# Patient Record
Sex: Female | Born: 1965 | Race: White | Hispanic: No | Marital: Single | Smoking: Never smoker
Health system: Southern US, Community
[De-identification: ages and names within clinical notes are randomized; demographics above are authoritative.]

## PROBLEM LIST (undated history)

## (undated) DIAGNOSIS — Z889 Allergy status to unspecified drugs, medicaments and biological substances status: Secondary | ICD-10-CM

## (undated) DIAGNOSIS — F419 Anxiety disorder, unspecified: Secondary | ICD-10-CM

## (undated) DIAGNOSIS — G629 Polyneuropathy, unspecified: Secondary | ICD-10-CM

## (undated) DIAGNOSIS — F32A Depression, unspecified: Secondary | ICD-10-CM

## (undated) HISTORY — PX: VESICOVAGINAL FISTULA CLOSURE: SUR270

## (undated) HISTORY — PX: WISDOM TOOTH EXTRACTION: SHX21

---

## 2004-09-20 ENCOUNTER — Inpatient Hospital Stay (HOSPITAL_COMMUNITY): Admission: AD | Admit: 2004-09-20 | Discharge: 2004-09-20 | Payer: Self-pay | Admitting: Obstetrics and Gynecology

## 2004-09-20 ENCOUNTER — Encounter: Payer: Self-pay | Admitting: Emergency Medicine

## 2004-09-24 ENCOUNTER — Inpatient Hospital Stay (HOSPITAL_COMMUNITY): Admission: AD | Admit: 2004-09-24 | Discharge: 2004-09-24 | Payer: Self-pay | Admitting: *Deleted

## 2004-09-25 ENCOUNTER — Emergency Department (HOSPITAL_COMMUNITY): Admission: EM | Admit: 2004-09-25 | Discharge: 2004-09-25 | Payer: Self-pay | Admitting: Emergency Medicine

## 2004-10-05 ENCOUNTER — Ambulatory Visit: Payer: Self-pay | Admitting: Obstetrics and Gynecology

## 2006-02-06 IMAGING — US US OB TRANSVAGINAL MODIFY
1 series · 14 of 28 positions shown · non-contrast
Comparison: none

CLINICAL DATA: Positive urine pregnancy test. Beta-hCG pending. Bleeding for
several days.

TRANSABDOMINAL AND TRANSVAGINAL OB ULTRASOUND LESS THAN 14 WEEKS:

[Series 1: unknown · 0.27mm/px · 14 of 87 slices shown]
[im 4/87]
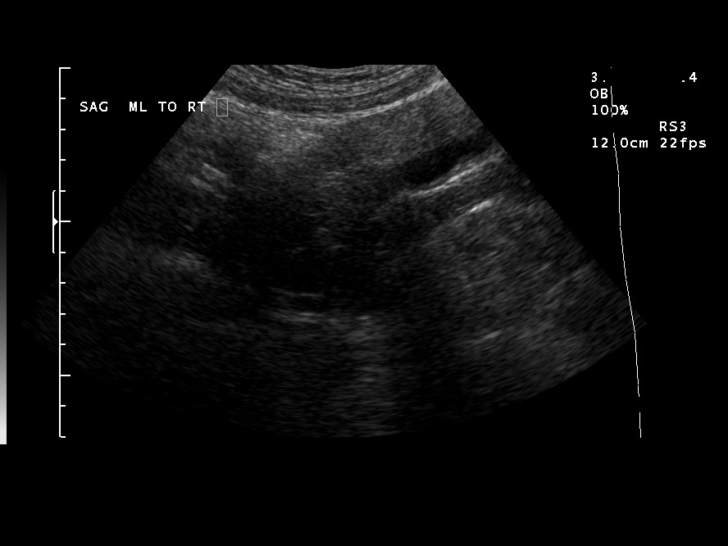
[im 10/87]
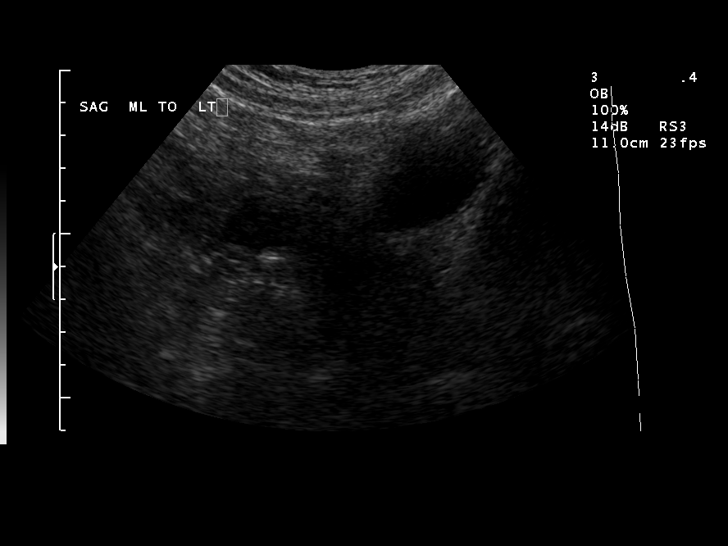
[im 16/87]
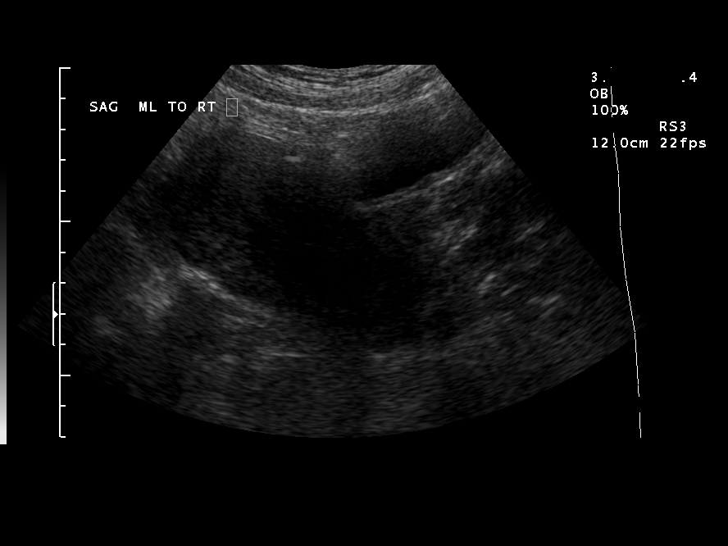
[im 23/87]
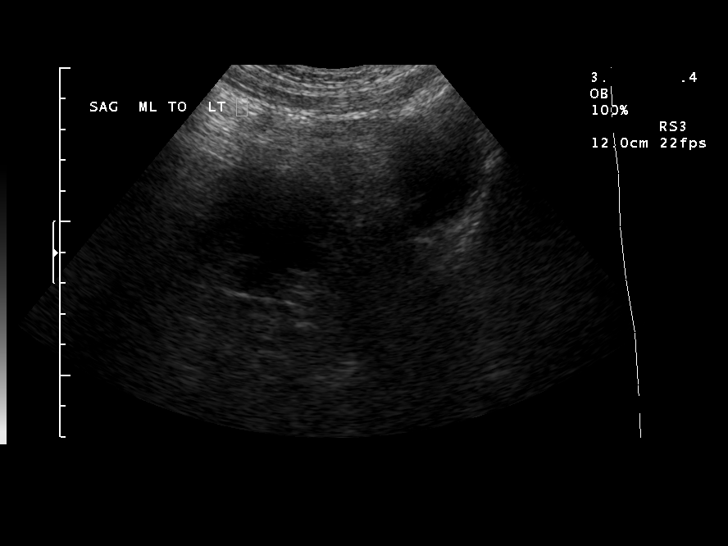
[im 29/87]
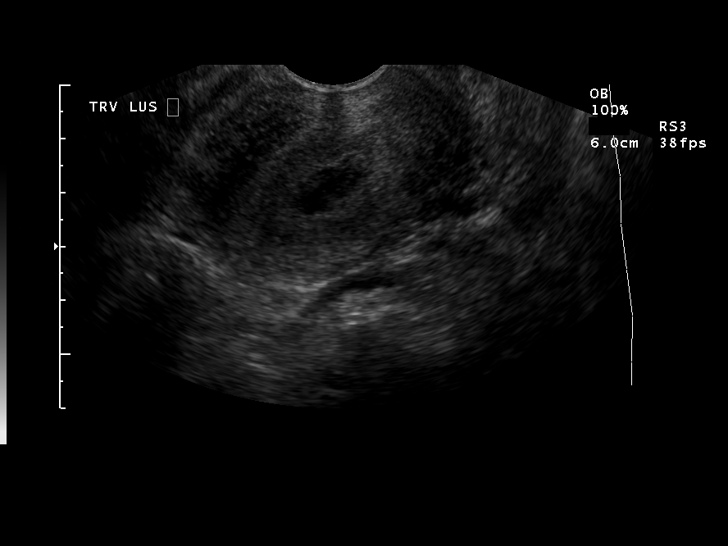
[im 36/87]
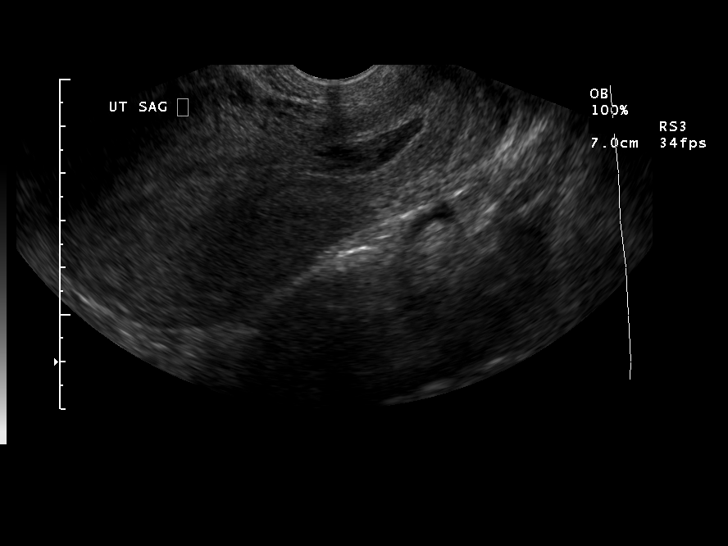
[im 42/87]
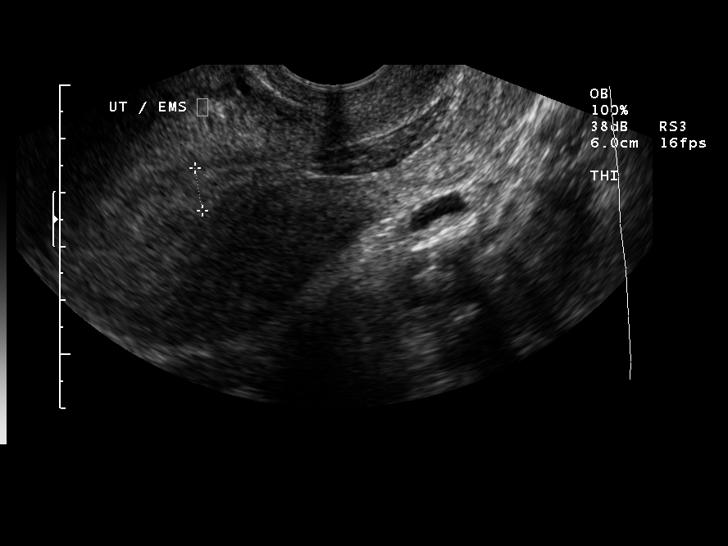
[im 48/87]
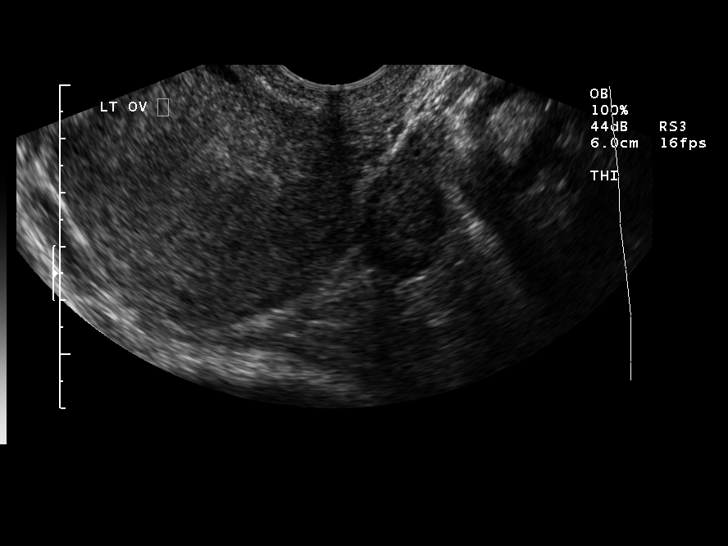
[im 55/87]
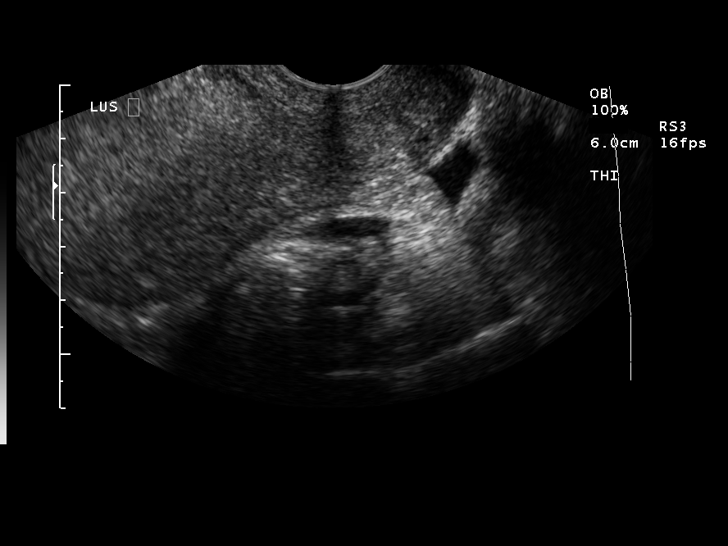
[im 61/87]
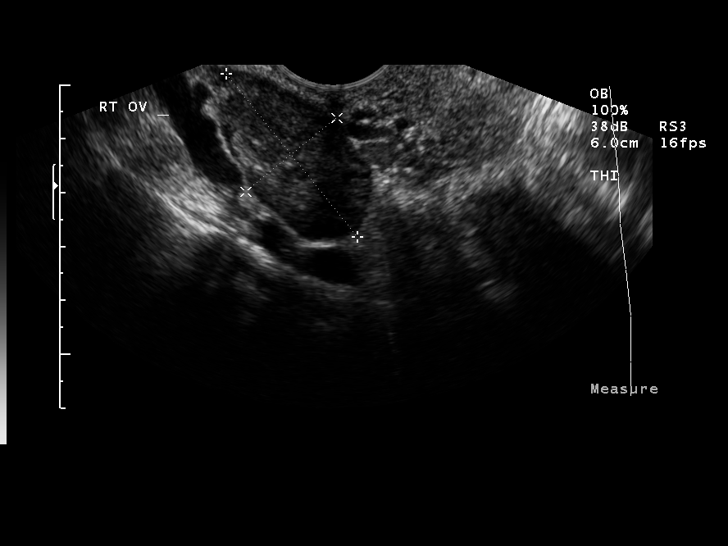
[im 67/87]
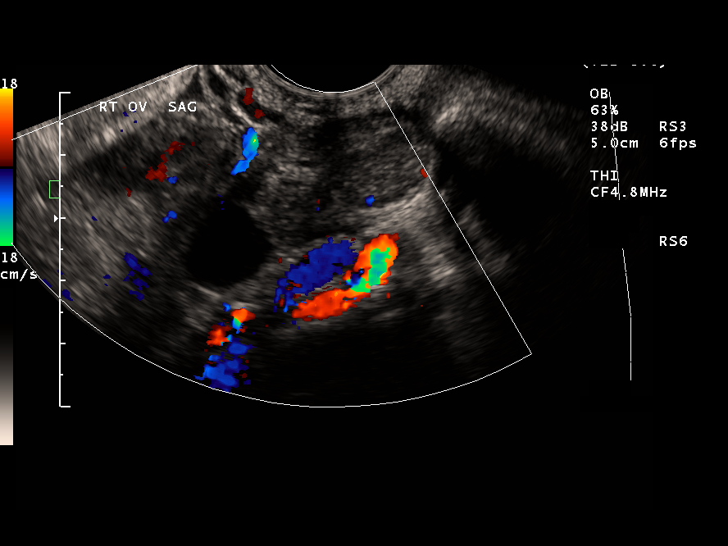
[im 74/87]
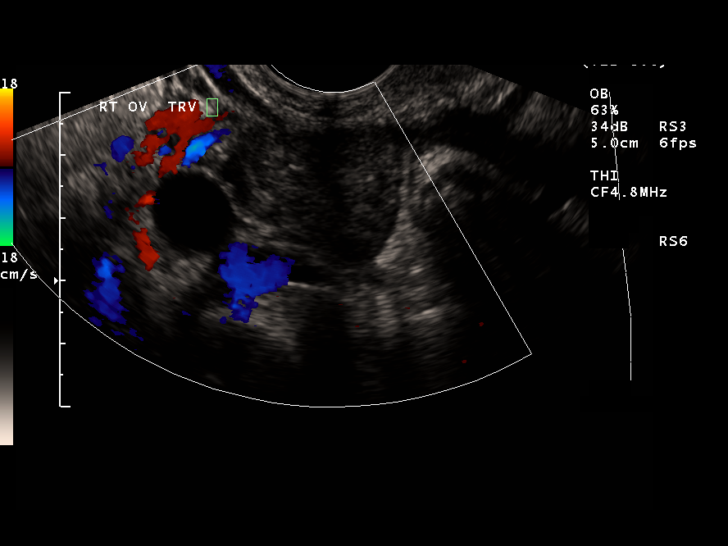
[im 80/87]
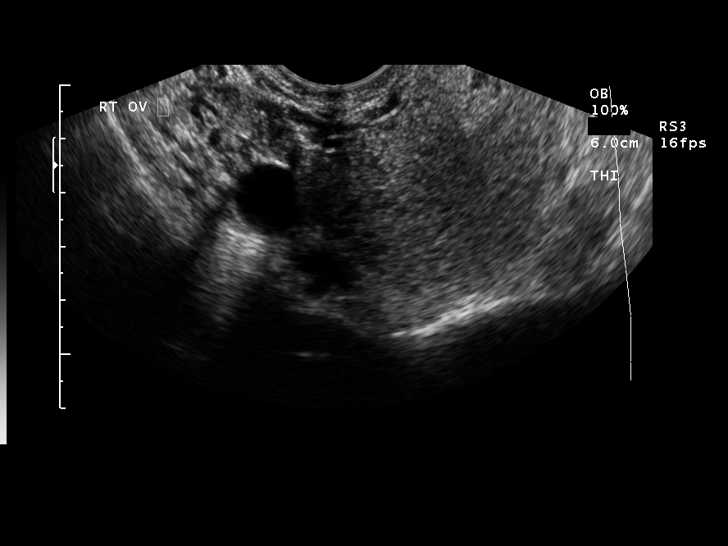
[im 87/87]
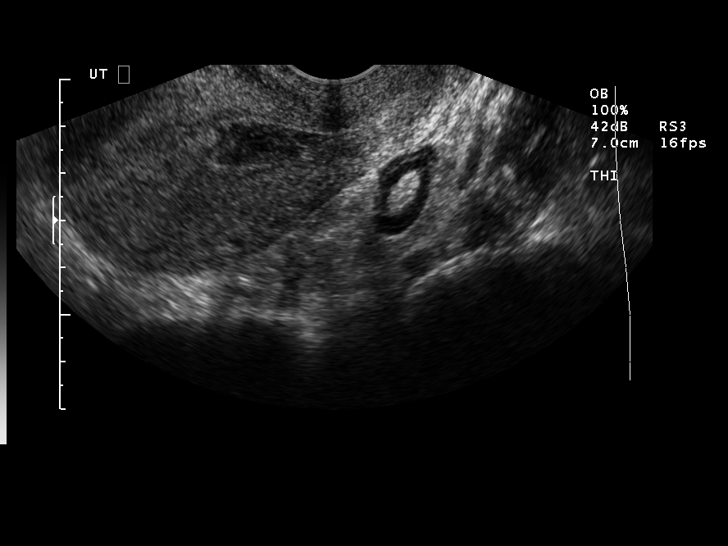

[14 of 28 positions shown; findings below may reference images not displayed]

FINDINGS: No intrauterine pregnancy is identified. There is hypoechoic material
noted within the endometrium of the lower uterine segment, possibly representing
blood. Endometrial stripe measures 8 mm.

Left ovary is normal. There is a 1.5 cm complex cystic area within the right
ovary, possibly hemorrhagic cyst. A small amount of free fluid isn't in the
pelvis.
IMPRESSION: 1. No intrauterine pregnancy currently identified.
2. Complex, hypoechoic material in the lower uterine segment, question blood.
3. Small hemorrhagic cyst on the right ovary.
4. Small amount of free fluid

## 2010-02-19 ENCOUNTER — Ambulatory Visit (HOSPITAL_COMMUNITY): Admission: RE | Admit: 2010-02-19 | Discharge: 2010-02-19 | Payer: Self-pay | Admitting: General Surgery

## 2010-09-26 ENCOUNTER — Encounter: Payer: Self-pay | Admitting: Family Medicine
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
## 2013-02-27 ENCOUNTER — Encounter (HOSPITAL_COMMUNITY): Payer: Self-pay | Admitting: *Deleted

## 2013-02-27 ENCOUNTER — Emergency Department (HOSPITAL_COMMUNITY)
Admission: EM | Admit: 2013-02-27 | Discharge: 2013-02-28 | Disposition: A | Discharge status: Home / Self Care | Payer: No Typology Code available for payment source | Attending: Emergency Medicine | Admitting: Emergency Medicine

## 2013-02-27 ENCOUNTER — Emergency Department (HOSPITAL_COMMUNITY): Payer: Self-pay

## 2013-02-27 DIAGNOSIS — S300XXA Contusion of lower back and pelvis, initial encounter: Secondary | ICD-10-CM

## 2013-02-27 DIAGNOSIS — R55 Syncope and collapse: Secondary | ICD-10-CM | POA: Insufficient documentation

## 2013-02-27 DIAGNOSIS — R42 Dizziness and giddiness: Secondary | ICD-10-CM | POA: Insufficient documentation

## 2013-02-27 DIAGNOSIS — N949 Unspecified condition associated with female genital organs and menstrual cycle: Secondary | ICD-10-CM | POA: Insufficient documentation

## 2013-02-27 LAB — CBC WITH DIFFERENTIAL/PLATELET
Basophils Absolute: 0.1 10*3/uL (ref 0.0–0.1)
Basophils Relative: 1 % (ref 0–1)
Eosinophils Absolute: 0.2 10*3/uL (ref 0.0–0.7)
Eosinophils Relative: 3 % (ref 0–5)
MCH: 29.9 pg (ref 26.0–34.0)
MCHC: 32.3 g/dL (ref 30.0–36.0)
MCV: 92.6 fL (ref 78.0–100.0)
Platelets: 311 10*3/uL (ref 150–400)
RDW: 15.1 % (ref 11.5–15.5)
WBC: 9.7 10*3/uL (ref 4.0–10.5)

## 2013-02-27 LAB — COMPREHENSIVE METABOLIC PANEL
ALT: 19 U/L (ref 0–35)
AST: 17 U/L (ref 0–37)
Albumin: 3.9 g/dL (ref 3.5–5.2)
Alkaline Phosphatase: 76 U/L (ref 39–117)
BUN: 22 mg/dL (ref 6–23)
Chloride: 102 mEq/L (ref 96–112)
Potassium: 3.8 mEq/L (ref 3.5–5.1)
Total Bilirubin: 0.3 mg/dL (ref 0.3–1.2)

## 2013-02-27 LAB — GLUCOSE, CAPILLARY: Glucose-Capillary: 125 mg/dL — ABNORMAL HIGH (ref 70–99)

## 2013-02-27 MED ORDER — HYDROCODONE-ACETAMINOPHEN 5-325 MG PO TABS
1.0000 | ORAL_TABLET | Freq: Once | ORAL | Status: AC
  Administered 2013-02-27: 1 via ORAL
  Filled 2013-02-27: qty 1

## 2013-02-27 NOTE — ED Notes (Signed)
Pt was in [place]  ED with her daughter in room 63. Pt walked out of room and had a syncopal episode. This fall was witnessed per ED staff. Pt states, "i felt like i was in a tunnel then i saw a lot of people around me." pt with cbg of 60. Pt given oj with sugar. Pt was taken to room 5. Pt c/o left hip pain otherwise no other complaints.

## 2013-02-27 NOTE — ED Notes (Signed)
Pts CBG 60- 2 glasses of orange juice given.

## 2013-02-27 NOTE — ED Notes (Signed)
[id number]<AV> Expected date:<BR> Expected time:<BR> Means of arrival:<BR> Comments:<BR> Visitor fall

## 2013-02-27 NOTE — ED Provider Notes (Signed)
History    CSN: [id number] Arrival date & time 02/27/13  2216  First MD Initiated Contact with Patient 02/27/13 2230     Chief Complaint  Patient presents with  . Near Syncope   (Consider location/radiation/quality/duration/timing/severity/associated sxs/prior Treatment) HPI Comments: Patient with no significant PMH presents after passing out and falling while in emergency department. She was with another patient who was being seen for an open forearm fracture and became lightheaded when the surgeon was talking to him. She describes tunnel vision and then waking up with a lot of people standing over her. Blood sugar was found to be 60 and this improved with orange juice. Patient complains of left posterior pelvis pain. Otherwise patient feels well at current time. Onset of symptoms acute. Course is resolved. Nothing makes symptoms better or worse. Patient denies blood loss.  The history is provided by the patient.   History reviewed. No pertinent past medical history. Past Surgical History  Procedure Laterality Date  . Vesicovaginal fistula closure     History reviewed. No pertinent family history. History  Substance Use Topics  . Smoking status: Never Smoker   . Smokeless tobacco: Never Used  . Alcohol Use: No   OB History   Grav Para Term Preterm Abortions TAB SAB Ect Mult Living                 Review of Systems  Constitutional: Negative for fever.  HENT: Negative for sore throat and rhinorrhea.   Eyes: Negative for redness.  Respiratory: Negative for cough.   Cardiovascular: Negative for chest pain.  Gastrointestinal: Negative for nausea, vomiting, abdominal pain and diarrhea.  Genitourinary: Negative for dysuria.  Musculoskeletal: Positive for arthralgias. Negative for myalgias.  Skin: Negative for rash.  Neurological: Positive for syncope and light-headedness. Negative for headaches.    Allergies  Sulfa antibiotics  Home Medications   Current Outpatient Rx   Name  Route  Sig  Dispense  Refill  . ibuprofen (ADVIL,MOTRIN) 200 MG tablet   Oral   Take 600 mg by mouth every 6 (six) hours as needed for pain.          BP 96/54  Pulse 64  Temp(Src) 97.5 F (36.4 C) (Oral)  Resp 16  SpO2 97%  LMP 02/20/2013 Physical Exam  Nursing note and vitals reviewed. Constitutional: She appears well-developed and well-nourished.  HENT:  Head: Normocephalic and atraumatic.  Eyes: Conjunctivae are normal. Right eye exhibits no discharge. Left eye exhibits no discharge.  Neck: Normal range of motion. Neck supple.  Cardiovascular: Normal rate, regular rhythm and normal heart sounds.   Pulmonary/Chest: Effort normal and breath sounds normal.  Abdominal: Soft. There is no tenderness.  Musculoskeletal: She exhibits tenderness.  Tenderness isolated to L ischium. Full ROM of hip joints without pain.   Neurological: She is alert.  Skin: Skin is warm and dry.  Psychiatric: She has a normal mood and affect.    ED Course  Procedures (including critical care time) Labs Reviewed  CBC WITH DIFFERENTIAL - Abnormal; Notable for the following:    RBC 3.78 (*)    Hemoglobin 11.3 (*)    HCT 35.0 (*)    Neutrophils Relative % 41 (*)    Lymphocytes Relative 47 (*)    Lymphs Abs 4.5 (*)    All other components within normal limits  COMPREHENSIVE METABOLIC PANEL   Dg Pelvis 1-2 Views  02/27/2013   *RADIOLOGY REPORT*  Clinical Data: Fall, pain.  PELVIS - 1-2 VIEW  Comparison: None.  Findings: No acute bony abnormality.  Specifically, no fracture, subluxation, or dislocation.  Soft tissues are intact.  Hip joints and SI joints are symmetric and unremarkable.  IMPRESSION: No bony abnormality.   Original Report Authenticated By: Charlett Nose, M.D.   1. Syncope   2. Contusion of pelvis, initial encounter     11:02 PM Patient seen and examined. Work-up initiated. Medications ordered.   Vital signs reviewed and are as follows: Filed Vitals:   02/27/13 2224  BP:  96/54  Pulse: 64  Temp: 97.5 F (36.4 C)  Resp: 16    Date: 02/28/2013  Rate: 71  Rhythm: normal sinus rhythm  QRS Axis: normal  Intervals: normal  ST/T Wave abnormalities: normal  Conduction Disutrbances:none  Narrative Interpretation:   Old EKG Reviewed: none available  12:27 AM Patient informed of results. She ambulates well without lightheadedness. Discussed conservative management for contusion.   Urged rest and hydrate well.   Patient urged to return with worsening symptoms or other concerns. Patient verbalized understanding and agrees with plan.   MDM  Syncope: while in ED with patient with open fracture. Suspect vasovagal. Work-up is unrevealing. Pt is not orthostatic. Anemia mild.   Pelvis pain: neg x-ray. Ambulating well. Doubt occult fracture.    Renne Crigler, PA-C 02/28/13 0030

## 2013-02-27 NOTE — ED Notes (Signed)
Patient resting at this time. Blood glucose better, the patient is without any new complaints. Is awake and alert- oriented x 3

## 2013-02-28 MED ORDER — HYDROCODONE-ACETAMINOPHEN 5-325 MG PO TABS: ORAL_TABLET | ORAL | Status: DC

## 2013-02-28 MED ORDER — IBUPROFEN 600 MG PO TABS: 600.0000 mg | ORAL_TABLET | Freq: Four times a day (QID) | ORAL | Status: DC | PRN

## 2013-02-28 NOTE — ED Provider Notes (Signed)
Medical screening examination/treatment/procedure(s) were performed by non-physician practitioner and as supervising physician I was immediately available for consultation/collaboration.  Winfred Iiams, MD 02/28/13 0415 

## 2013-02-28 NOTE — ED Notes (Signed)
Pt was able to ambulate without no dizziness w/ Clinical research associate

## 2014-07-16 IMAGING — CR DG PELVIS 1-2V
1 series · 1 of 1 positions shown · non-contrast
Comparison: None.

CLINICAL DATA: Fall, pain.

PELVIS - 1-2 VIEW

[t pelvis ap]
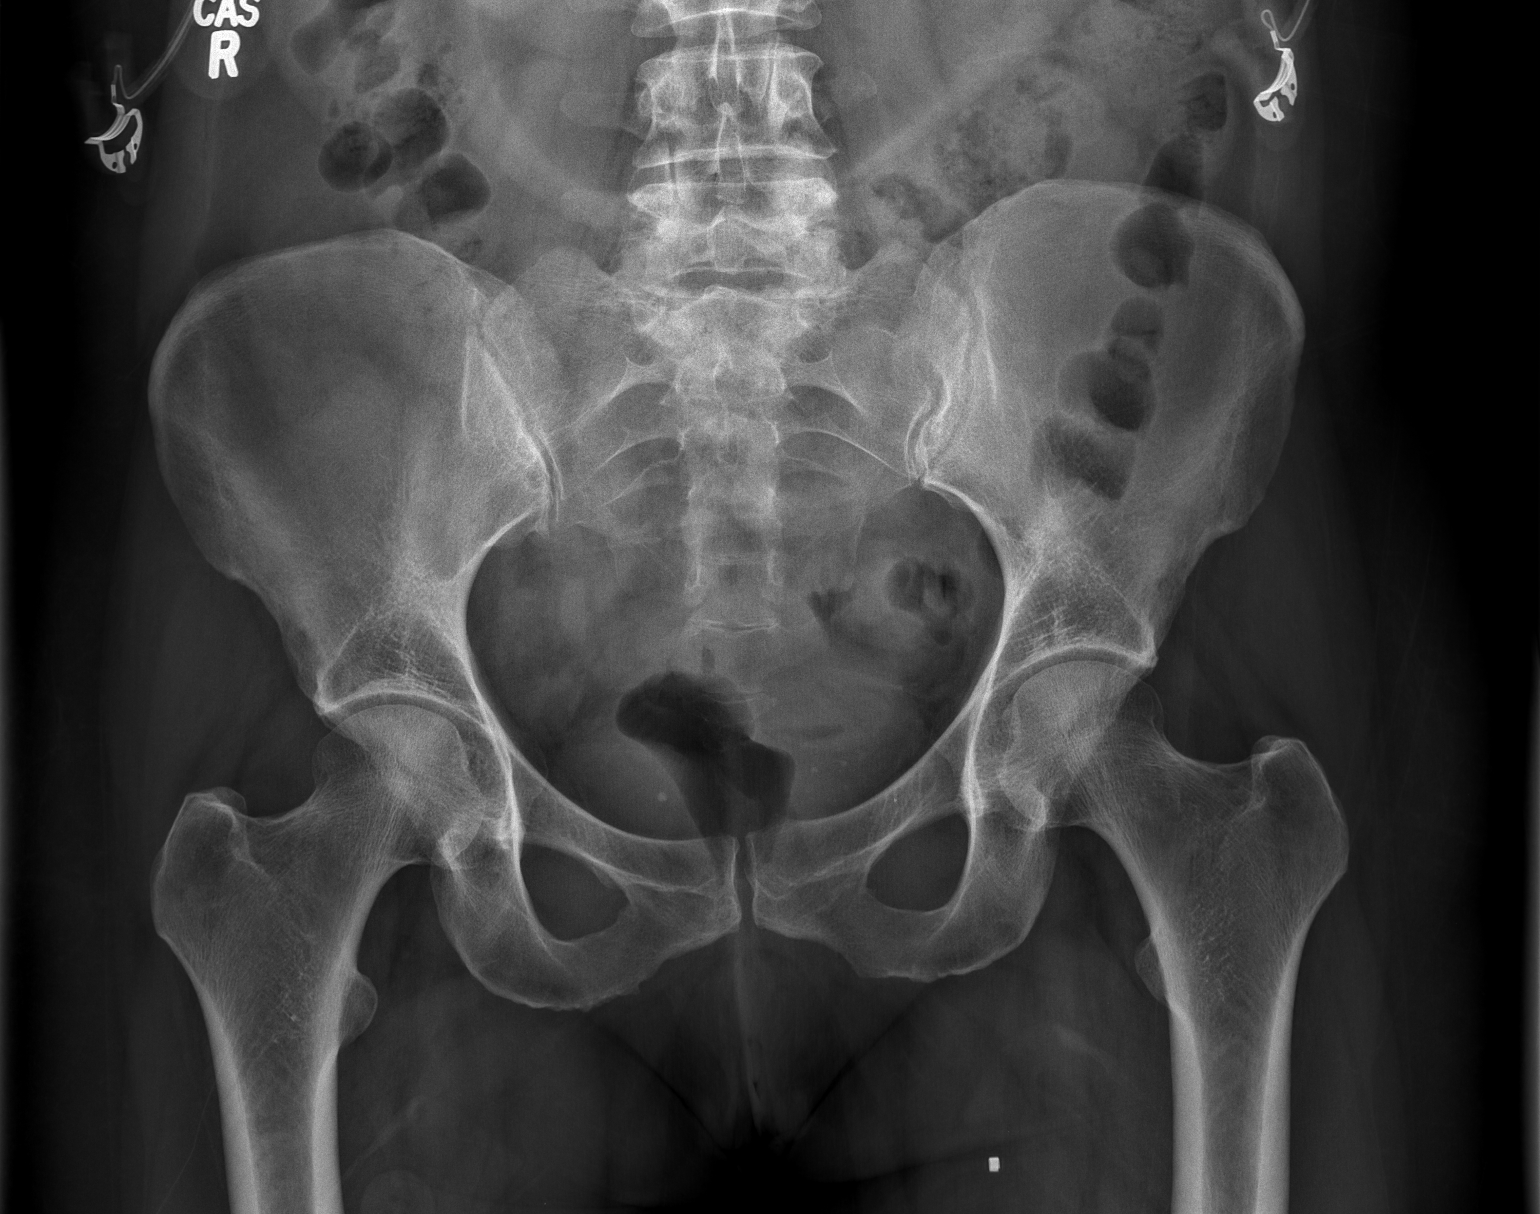

[1 of 1 positions shown; findings below may reference images not displayed]

FINDINGS: No acute bony abnormality.  Specifically, no fracture,
subluxation, or dislocation.  Soft tissues are intact.  Hip joints
and SI joints are symmetric and unremarkable.
IMPRESSION: No bony abnormality.

## 2020-05-10 ENCOUNTER — Emergency Department (HOSPITAL_COMMUNITY): Payer: 59

## 2020-05-10 ENCOUNTER — Emergency Department (HOSPITAL_COMMUNITY)
Admission: EM | Admit: 2020-05-10 | Discharge: 2020-05-10 | Disposition: A | Discharge status: Home / Self Care | Payer: 59 | Attending: Emergency Medicine | Admitting: Emergency Medicine

## 2020-05-10 ENCOUNTER — Encounter (HOSPITAL_COMMUNITY): Payer: Self-pay

## 2020-05-10 ENCOUNTER — Other Ambulatory Visit: Payer: Self-pay

## 2020-05-10 DIAGNOSIS — Z79899 Other long term (current) drug therapy: Secondary | ICD-10-CM | POA: Diagnosis not present

## 2020-05-10 DIAGNOSIS — S59912A Unspecified injury of left forearm, initial encounter: Secondary | ICD-10-CM | POA: Diagnosis present

## 2020-05-10 DIAGNOSIS — Y999 Unspecified external cause status: Secondary | ICD-10-CM | POA: Diagnosis not present

## 2020-05-10 DIAGNOSIS — Y9301 Activity, walking, marching and hiking: Secondary | ICD-10-CM | POA: Insufficient documentation

## 2020-05-10 DIAGNOSIS — S52615A Nondisplaced fracture of left ulna styloid process, initial encounter for closed fracture: Secondary | ICD-10-CM | POA: Insufficient documentation

## 2020-05-10 DIAGNOSIS — S52592A Other fractures of lower end of left radius, initial encounter for closed fracture: Secondary | ICD-10-CM

## 2020-05-10 DIAGNOSIS — S52502A Unspecified fracture of the lower end of left radius, initial encounter for closed fracture: Secondary | ICD-10-CM | POA: Insufficient documentation

## 2020-05-10 DIAGNOSIS — Y929 Unspecified place or not applicable: Secondary | ICD-10-CM | POA: Insufficient documentation

## 2020-05-10 DIAGNOSIS — W010XXA Fall on same level from slipping, tripping and stumbling without subsequent striking against object, initial encounter: Secondary | ICD-10-CM | POA: Diagnosis not present

## 2020-05-10 MED ORDER — HYDROCODONE-ACETAMINOPHEN 5-325 MG PO TABS
1.0000 | ORAL_TABLET | Freq: Once | ORAL | Status: AC
  Administered 2020-05-10: 1 via ORAL
  Filled 2020-05-10: qty 1

## 2020-05-10 MED ORDER — HYDROCODONE-ACETAMINOPHEN 5-325 MG PO TABS: 1.0000 | ORAL_TABLET | ORAL | 0 refills | Status: AC | PRN

## 2020-05-10 NOTE — Discharge Instructions (Addendum)
Home to rest.  Apply ice and elevate for 20 to time only 3 times daily. Take Norco as needed as prescribed for pain.  Do not drive or operate machinery if taking Norco.  Take Colace if you develop constipation. Leave your splint on, keep it clean, keep it dry. Follow-up with orthopedics, call on Tuesday to schedule an appointment.

## 2020-05-10 NOTE — ED Notes (Signed)
Ortho called for splint and shoulder immobilizer.

## 2020-05-10 NOTE — Progress Notes (Signed)
Orthopedic Tech Progress Note Patient Details:  Pamela Black [birth year] [id number]  Ortho Devices Type of Ortho Device: Arm sling, Sugartong splint Ortho Device/Splint Location: LUE Ortho Device/Splint Interventions: Ordered, Application   Post Interventions Patient Tolerated: Well Instructions Provided: Care of device   Donald Pore 05/10/2020, 11:34 AM

## 2020-05-10 NOTE — ED Provider Notes (Signed)
Medical screening examination/treatment/procedure(s) were conducted as a shared visit with non-physician practitioner(s) and myself.  I personally evaluated the patient during the encounter.  Clinical Impression:   Final diagnoses:  Other closed fracture of distal end of left radius, initial encounter  Closed nondisplaced fracture of styloid process of left ulna, initial encounter    This patient has a swollen left wrist after having a fall on an outstretched hand.  Neurovascularly intact, able to open and close the hand but with some pain.  Decreased range of motion of the wrist.  Normal sensation diffusely.  X-ray shows a distal radial fracture, patient will be immobilized and followed up with orthopedics.  Stable for discharge and patient understands indications for return   Eber Hong, MD 05/10/20 1214

## 2020-05-10 NOTE — ED Provider Notes (Signed)
[facility] EMERGENCY DEPARTMENT Provider Note   CSN: [id number] Arrival date & time: 05/10/20  2330     History No chief complaint on file.   Pamela Black is a 54 y.o. female.  54 year old female presents with complaint of left wrist and forearm pain.  Patient states that she was mowing the grass yesterday and while walking backwards tripped and fell landing on outstretched left hand.  Patient is left-handed dominant, reports pain and swelling left wrist with pain radiating up to the left elbow.  No other injuries, complaints, concerns.        History reviewed. No pertinent past medical history.  There are no problems to display for this patient.   Past Surgical History:  Procedure Laterality Date  . VESICOVAGINAL FISTULA CLOSURE       OB History   No obstetric history on file.     No family history on file.  Social History   Tobacco Use  . Smoking status: Never Smoker  . Smokeless tobacco: Never Used  Substance Use Topics  . Alcohol use: No  . Drug use: Yes    Types: Marijuana    Home Medications Prior to Admission medications   Medication Sig Start Date End Date Taking? Authorizing Provider  HYDROcodone-acetaminophen (NORCO/VICODIN) 5-325 MG tablet Take 1 tablet by mouth every 4 (four) hours as needed. 05/10/20   Jeannie Fend, PA-C  ibuprofen (ADVIL,MOTRIN) 600 MG tablet Take 1 tablet (600 mg total) by mouth every 6 (six) hours as needed for pain. 02/28/13   Renne Crigler, PA-C    Allergies    Sulfa antibiotics  Review of Systems   Review of Systems  Constitutional: Negative for fever.  Musculoskeletal: Positive for arthralgias, joint swelling and myalgias.  Skin: Negative for color change, rash and wound.  Allergic/Immunologic: Negative for immunocompromised state.  Neurological: Negative for weakness and numbness.  All other systems reviewed and are negative.   Physical Exam Updated Vital Signs BP 136/75   Pulse (!) 44    Temp 98.7 F (37.1 C) (Oral)   Resp 18   SpO2 98%   Physical Exam Vitals and nursing note reviewed.  Constitutional:      General: She is not in acute distress.    Appearance: She is well-developed. She is not diaphoretic.  HENT:     Head: Normocephalic and atraumatic.  Cardiovascular:     Pulses: Normal pulses.  Pulmonary:     Effort: Pulmonary effort is normal.  Musculoskeletal:        General: Swelling, tenderness and deformity present.     Left shoulder: Normal. No swelling, tenderness or bony tenderness.     Left elbow: No swelling or deformity. Normal range of motion. No tenderness.     Left wrist: Swelling, deformity and tenderness present. Decreased range of motion. Normal pulse.     Left hand: No bony tenderness. Normal sensation.     Comments: Swelling with slight deformity to distal left radius with tenderness to same, significantly limited range of motion left wrist.  Brisk capillary refill present in each digit, sensation intact, palpable radial pulse.  Skin:    General: Skin is warm and dry.     Capillary Refill: Capillary refill takes less than 2 seconds.     Findings: No erythema or rash.  Neurological:     Mental Status: She is alert and oriented to person, place, and time.     Sensory: No sensory deficit.  Psychiatric:  Behavior: Behavior normal.     ED Results / Procedures / Treatments   Labs (all labs ordered are listed, but only abnormal results are displayed) Labs Reviewed - No data to display  EKG None  Radiology DG Forearm Left  Result Date: 05/10/2020 CLINICAL DATA:  Larey Seat last night.  Left wrist and forearm pain. EXAM: LEFT FOREARM - 2 VIEW COMPARISON:  Current left wrist radiographs. FINDINGS: Fracture of the distal radius and ulnar styloid are again noted, described under the left wrist radiographs. No additional fractures. Wrist and elbow joints are normally spaced and aligned. There is distal forearm and wrist soft tissue swelling.  IMPRESSION: 1. Distal radius and ulnar styloid fractures as detailed under the left wrist radiographs. 2. No other fractures.  No dislocation Electronically Signed   By: Amie Portland M.D.   On: 05/10/2020 10:23   DG Wrist Complete Left  Result Date: 05/10/2020 CLINICAL DATA:  Larey Seat last night.  Left wrist and forearm pain. EXAM: LEFT WRIST - COMPLETE 3+ VIEW COMPARISON:  None. FINDINGS: Transverse fracture of the distal radial metaphysis, with no evidence of an intra-articular component. There is mild comminution and dorsal impaction of the fracture leading to mild dorsal angulation of the distal radial articular surface of approximately 15 degrees. Fracture is slightly displaced posteriorly by 3 mm. Associated nondisplaced ulnar styloid fracture. No other fractures.  Joints are normally spaced and aligned. There is diffuse soft tissue swelling. IMPRESSION: 1. Fracture of distal radius as detailed with an associated nondisplaced ulnar styloid fracture. No dislocation. Electronically Signed   By: Amie Portland M.D.   On: 05/10/2020 10:22    Procedures Procedures (including critical care time)  Medications Ordered in ED Medications  HYDROcodone-acetaminophen (NORCO/VICODIN) 5-325 MG per tablet 1 tablet (1 tablet Oral Given 05/10/20 1149)    ED Course  I have reviewed the triage vital signs and the nursing notes.  Pertinent labs & imaging results that were available during my care of the patient were reviewed by me and considered in my medical decision making (see chart for details).  Clinical Course as of May 10 1150  Sun May 10, 2020  5313 54 year old female presents with fall and injury to dominant left wrist.  On exam, has tenderness with swelling to left wrist, x-ray with distal radius and ulnar styloid fractures. Patient was given Norco for pain with plan for sugar tong splint, sling, home to rest, ice, elevate, follow-up with Ortho.   [LM]  1058 Case discussed with Dr. Aundria Rud, on-call with  hand Ortho, agrees with plan of care, recommends follow-up with his colleague Dr. Roney Mans this week.   [LM]  1149 SPLINT APPLICATION Date/Time: 11:49 AM Authorized by: Jeannie Fend Consent: Verbal consent obtained. Risks and benefits: risks, benefits and alternatives were discussed Consent given by: patient Splint applied by: orthopedic technician Location details: left arm Splint type: sugar tong with sling Supplies used: fiberglass OCL, ace, sling Post-procedure: The splinted body part was neurovascularly unchanged following the procedure. Patient tolerance: Patient tolerated the procedure well with no immediate complications.      [LM]    Clinical Course User Index [LM] Alden Hipp   MDM Rules/Calculators/A&P                           Final Clinical Impression(s) / ED Diagnoses Final diagnoses:  Other closed fracture of distal end of left radius, initial encounter  Closed nondisplaced fracture of styloid process of  left ulna, initial encounter    Rx / DC Orders ED Discharge Orders         Ordered    HYDROcodone-acetaminophen (NORCO/VICODIN) 5-325 MG tablet  Every 4 hours PRN        05/10/20 1048           Alden Hipp 05/10/20 1151    Eber Hong, MD 05/10/20 1214

## 2020-05-10 NOTE — ED Triage Notes (Signed)
Patient complains of left wrist and forearm pain after fall last night, mild swelling to same, positive distal pulses

## 2020-05-14 ENCOUNTER — Encounter (HOSPITAL_COMMUNITY): Payer: Self-pay | Admitting: Orthopedic Surgery

## 2020-05-14 ENCOUNTER — Other Ambulatory Visit: Payer: Self-pay

## 2020-05-14 NOTE — Progress Notes (Signed)
Pamela Black denies chest pain or shortness of breath. Patient will be tested for Covid tomorrow.

## 2020-05-15 ENCOUNTER — Other Ambulatory Visit (HOSPITAL_COMMUNITY)
Admission: RE | Admit: 2020-05-15 | Discharge: 2020-05-15 | Disposition: A | Discharge status: Home / Self Care | Payer: 59 | Source: Ambulatory Visit | Attending: Orthopedic Surgery | Admitting: Orthopedic Surgery

## 2020-05-15 ENCOUNTER — Encounter (HOSPITAL_COMMUNITY): Payer: Self-pay | Admitting: Orthopedic Surgery

## 2020-05-15 DIAGNOSIS — Z01812 Encounter for preprocedural laboratory examination: Secondary | ICD-10-CM | POA: Diagnosis present

## 2020-05-15 DIAGNOSIS — Z20822 Contact with and (suspected) exposure to covid-19: Secondary | ICD-10-CM | POA: Diagnosis not present

## 2020-05-15 LAB — SARS CORONAVIRUS 2 (TAT 6-24 HRS): SARS Coronavirus 2: NEGATIVE

## 2020-05-15 NOTE — Anesthesia Preprocedure Evaluation (Addendum)
Anesthesia Evaluation  Patient identified by MRN, date of birth, ID band Patient awake    Reviewed: Allergy & Precautions, NPO status , Patient's Chart, lab work & pertinent test results  Airway Mallampati: I  TM Distance: >3 FB Neck ROM: Full    Dental  (+) Teeth Intact, Dental Advisory Given   Pulmonary neg pulmonary ROS,    breath sounds clear to auscultation       Cardiovascular negative cardio ROS   Rhythm:Regular Rate:Normal     Neuro/Psych negative neurological ROS     GI/Hepatic negative GI ROS, Neg liver ROS,   Endo/Other  negative endocrine ROS  Renal/GU negative Renal ROS     Musculoskeletal   Abdominal Normal abdominal exam  (+)   Peds  Hematology negative hematology ROS (+)   Anesthesia Other Findings   Reproductive/Obstetrics                            Anesthesia Physical Anesthesia Plan  ASA: II  Anesthesia Plan: Regional   Post-op Pain Management:    Induction: Intravenous  PONV Risk Score and Plan: 3 and Ondansetron, Propofol infusion and Midazolam  Airway Management Planned: Natural Airway and Simple Face Mask  Additional Equipment: None  Intra-op Plan:   Post-operative Plan:   Informed Consent: I have reviewed the patients History and Physical, chart, labs and discussed the procedure including the risks, benefits and alternatives for the proposed anesthesia with the patient or authorized representative who has indicated his/her understanding and acceptance.     Dental advisory given  Plan Discussed with: CRNA  Anesthesia Plan Comments: (Also consented for GA  Lab Results      Component                Value               Date                      WBC                      9.7                 02/27/2013                HGB                      11.3 (L)            02/27/2013                HCT                      35.0 (L)            02/27/2013                 MCV                      92.6                02/27/2013                PLT                      311                 02/27/2013           )  Anesthesia Quick Evaluation  

## 2020-05-16 ENCOUNTER — Ambulatory Visit (HOSPITAL_COMMUNITY): Payer: 59

## 2020-05-16 ENCOUNTER — Ambulatory Visit (HOSPITAL_COMMUNITY): Payer: 59 | Admitting: Anesthesiology

## 2020-05-16 ENCOUNTER — Encounter (HOSPITAL_COMMUNITY)
Admission: AC | Disposition: A | Discharge status: Home / Self Care | Payer: Self-pay | Source: Home / Self Care | Attending: Orthopedic Surgery

## 2020-05-16 ENCOUNTER — Other Ambulatory Visit: Payer: Self-pay

## 2020-05-16 ENCOUNTER — Ambulatory Visit (HOSPITAL_COMMUNITY)
Admission: AC | Admit: 2020-05-16 | Discharge: 2020-05-16 | Disposition: A | Discharge status: Home / Self Care | Payer: 59 | Attending: Orthopedic Surgery | Admitting: Orthopedic Surgery

## 2020-05-16 ENCOUNTER — Encounter (HOSPITAL_COMMUNITY): Payer: Self-pay | Admitting: Orthopedic Surgery

## 2020-05-16 DIAGNOSIS — S52572A Other intraarticular fracture of lower end of left radius, initial encounter for closed fracture: Secondary | ICD-10-CM | POA: Diagnosis not present

## 2020-05-16 DIAGNOSIS — X58XXXA Exposure to other specified factors, initial encounter: Secondary | ICD-10-CM | POA: Diagnosis not present

## 2020-05-16 DIAGNOSIS — Z882 Allergy status to sulfonamides status: Secondary | ICD-10-CM | POA: Diagnosis not present

## 2020-05-16 DIAGNOSIS — F329 Major depressive disorder, single episode, unspecified: Secondary | ICD-10-CM | POA: Diagnosis not present

## 2020-05-16 DIAGNOSIS — Z79899 Other long term (current) drug therapy: Secondary | ICD-10-CM | POA: Diagnosis not present

## 2020-05-16 DIAGNOSIS — G629 Polyneuropathy, unspecified: Secondary | ICD-10-CM | POA: Insufficient documentation

## 2020-05-16 DIAGNOSIS — Y939 Activity, unspecified: Secondary | ICD-10-CM | POA: Insufficient documentation

## 2020-05-16 DIAGNOSIS — Z885 Allergy status to narcotic agent status: Secondary | ICD-10-CM | POA: Insufficient documentation

## 2020-05-16 HISTORY — DX: Depression, unspecified: F32.A

## 2020-05-16 HISTORY — DX: Allergy status to unspecified drugs, medicaments and biological substances: Z88.9

## 2020-05-16 HISTORY — DX: Polyneuropathy, unspecified: G62.9

## 2020-05-16 HISTORY — DX: Anxiety disorder, unspecified: F41.9

## 2020-05-16 HISTORY — PX: ORIF WRIST FRACTURE: SHX2133

## 2020-05-16 SURGERY — OPEN REDUCTION INTERNAL FIXATION (ORIF) WRIST FRACTURE
Anesthesia: Regional | Site: Wrist | Laterality: Left

## 2020-05-16 MED ORDER — HYDROMORPHONE HCL 1 MG/ML IJ SOLN: 0.2500 mg | INTRAMUSCULAR | Status: DC | PRN

## 2020-05-16 MED ORDER — DEXAMETHASONE SODIUM PHOSPHATE 10 MG/ML IJ SOLN
INTRAMUSCULAR | Status: DC | PRN
  Administered 2020-05-16: 5 mg via INTRAVENOUS

## 2020-05-16 MED ORDER — LACTATED RINGERS IV SOLN: INTRAVENOUS | Status: DC

## 2020-05-16 MED ORDER — LIDOCAINE 2% (20 MG/ML) 5 ML SYRINGE
INTRAMUSCULAR | Status: DC | PRN
  Administered 2020-05-16: 20 mg via INTRAVENOUS

## 2020-05-16 MED ORDER — MIDAZOLAM HCL 5 MG/5ML IJ SOLN
INTRAMUSCULAR | Status: DC | PRN
  Administered 2020-05-16 (×2): 1 mg via INTRAVENOUS

## 2020-05-16 MED ORDER — OXYCODONE HCL 5 MG PO TABS: 5.0000 mg | ORAL_TABLET | Freq: Once | ORAL | Status: DC | PRN

## 2020-05-16 MED ORDER — DEXAMETHASONE SODIUM PHOSPHATE 10 MG/ML IJ SOLN
INTRAMUSCULAR | Status: AC
  Filled 2020-05-16: qty 1

## 2020-05-16 MED ORDER — LIDOCAINE 2% (20 MG/ML) 5 ML SYRINGE
INTRAMUSCULAR | Status: AC
  Filled 2020-05-16: qty 5

## 2020-05-16 MED ORDER — 0.9 % SODIUM CHLORIDE (POUR BTL) OPTIME
TOPICAL | Status: DC | PRN
  Administered 2020-05-16: 1000 mL

## 2020-05-16 MED ORDER — PHENYLEPHRINE 40 MCG/ML (10ML) SYRINGE FOR IV PUSH (FOR BLOOD PRESSURE SUPPORT)
PREFILLED_SYRINGE | INTRAVENOUS | Status: AC
  Filled 2020-05-16: qty 10

## 2020-05-16 MED ORDER — ONDANSETRON HCL 4 MG/2ML IJ SOLN
INTRAMUSCULAR | Status: DC | PRN
  Administered 2020-05-16: 4 mg via INTRAVENOUS

## 2020-05-16 MED ORDER — ACETAMINOPHEN 325 MG PO TABS: 325.0000 mg | ORAL_TABLET | Freq: Once | ORAL | Status: DC | PRN

## 2020-05-16 MED ORDER — FENTANYL CITRATE (PF) 100 MCG/2ML IJ SOLN
INTRAMUSCULAR | Status: AC
  Filled 2020-05-16: qty 2

## 2020-05-16 MED ORDER — PROPOFOL 500 MG/50ML IV EMUL
INTRAVENOUS | Status: DC | PRN
  Administered 2020-05-16: 60 ug/kg/min via INTRAVENOUS

## 2020-05-16 MED ORDER — ORAL CARE MOUTH RINSE: 15.0000 mL | Freq: Once | OROMUCOSAL | Status: AC

## 2020-05-16 MED ORDER — OXYCODONE HCL 5 MG/5ML PO SOLN: 5.0000 mg | Freq: Once | ORAL | Status: DC | PRN

## 2020-05-16 MED ORDER — ACETAMINOPHEN 160 MG/5ML PO SOLN: 325.0000 mg | Freq: Once | ORAL | Status: DC | PRN

## 2020-05-16 MED ORDER — CEFAZOLIN SODIUM-DEXTROSE 2-4 GM/100ML-% IV SOLN
2.0000 g | INTRAVENOUS | Status: AC
  Administered 2020-05-16: 2 g via INTRAVENOUS
  Filled 2020-05-16: qty 100

## 2020-05-16 MED ORDER — MEPERIDINE HCL 25 MG/ML IJ SOLN: 6.2500 mg | INTRAMUSCULAR | Status: DC | PRN

## 2020-05-16 MED ORDER — ACETAMINOPHEN 10 MG/ML IV SOLN: 1000.0000 mg | Freq: Once | INTRAVENOUS | Status: DC | PRN

## 2020-05-16 MED ORDER — BUPIVACAINE-EPINEPHRINE (PF) 0.5% -1:200000 IJ SOLN
INTRAMUSCULAR | Status: DC | PRN
  Administered 2020-05-16: 20 mL via PERINEURAL

## 2020-05-16 MED ORDER — MIDAZOLAM HCL 2 MG/2ML IJ SOLN
INTRAMUSCULAR | Status: AC
  Filled 2020-05-16: qty 2

## 2020-05-16 MED ORDER — GLYCOPYRROLATE 0.2 MG/ML IJ SOLN
INTRAMUSCULAR | Status: DC | PRN
  Administered 2020-05-16: .2 mg via INTRAVENOUS

## 2020-05-16 MED ORDER — ONDANSETRON HCL 4 MG/2ML IJ SOLN
INTRAMUSCULAR | Status: AC
  Filled 2020-05-16: qty 2

## 2020-05-16 MED ORDER — AMISULPRIDE (ANTIEMETIC) 5 MG/2ML IV SOLN: 10.0000 mg | Freq: Once | INTRAVENOUS | Status: DC | PRN

## 2020-05-16 MED ORDER — GLYCOPYRROLATE PF 0.2 MG/ML IJ SOSY
PREFILLED_SYRINGE | INTRAMUSCULAR | Status: AC
  Filled 2020-05-16: qty 1

## 2020-05-16 MED ORDER — CHLORHEXIDINE GLUCONATE 0.12 % MT SOLN
OROMUCOSAL | Status: AC
  Administered 2020-05-16: 15 mL via OROMUCOSAL
  Filled 2020-05-16: qty 15

## 2020-05-16 MED ORDER — PROPOFOL 1000 MG/100ML IV EMUL
INTRAVENOUS | Status: AC
  Filled 2020-05-16: qty 300

## 2020-05-16 MED ORDER — FENTANYL CITRATE (PF) 100 MCG/2ML IJ SOLN
INTRAMUSCULAR | Status: DC | PRN
  Administered 2020-05-16 (×2): 25 ug via INTRAVENOUS
  Administered 2020-05-16: 50 ug via INTRAVENOUS

## 2020-05-16 MED ORDER — CHLORHEXIDINE GLUCONATE 0.12 % MT SOLN: 15.0000 mL | Freq: Once | OROMUCOSAL | Status: AC

## 2020-05-16 MED ORDER — PHENYLEPHRINE HCL-NACL 10-0.9 MG/250ML-% IV SOLN
INTRAVENOUS | Status: DC | PRN
  Administered 2020-05-16: 20 ug/min via INTRAVENOUS

## 2020-05-16 MED ORDER — PROPOFOL 500 MG/50ML IV EMUL
INTRAVENOUS | Status: AC
  Filled 2020-05-16: qty 50

## 2020-05-16 MED ORDER — PHENYLEPHRINE HCL (PRESSORS) 10 MG/ML IV SOLN
INTRAVENOUS | Status: DC | PRN
  Administered 2020-05-16: 40 ug via INTRAVENOUS
  Administered 2020-05-16: 80 ug via INTRAVENOUS

## 2020-05-16 MED ORDER — PROPOFOL 10 MG/ML IV BOLUS
INTRAVENOUS | Status: DC | PRN
  Administered 2020-05-16: 30 mg via INTRAVENOUS
  Administered 2020-05-16: 40 mg via INTRAVENOUS

## 2020-05-16 SURGICAL SUPPLY — 63 items
BIT DRILL 2.2 SS TIBIAL (BIT) ×2 IMPLANT
BLADE CLIPPER SURG (BLADE) IMPLANT
BNDG CMPR 9X4 STRL LF SNTH (GAUZE/BANDAGES/DRESSINGS) ×1
BNDG ELASTIC 3X5.8 VLCR STR LF (GAUZE/BANDAGES/DRESSINGS) ×3 IMPLANT
BNDG ELASTIC 4X5.8 VLCR STR LF (GAUZE/BANDAGES/DRESSINGS) ×3 IMPLANT
BNDG ESMARK 4X9 LF (GAUZE/BANDAGES/DRESSINGS) ×3 IMPLANT
BNDG GAUZE ELAST 4 BULKY (GAUZE/BANDAGES/DRESSINGS) ×5 IMPLANT
CANISTER SUCT 3000ML PPV (MISCELLANEOUS) ×3 IMPLANT
CORD BIPOLAR FORCEPS 12FT (ELECTRODE) ×3 IMPLANT
COVER SURGICAL LIGHT HANDLE (MISCELLANEOUS) ×3 IMPLANT
COVER WAND RF STERILE (DRAPES) ×3 IMPLANT
CUFF TOURN SGL QUICK 18X4 (TOURNIQUET CUFF) ×3 IMPLANT
CUFF TOURN SGL QUICK 24 (TOURNIQUET CUFF)
CUFF TRNQT CYL 24X4X16.5-23 (TOURNIQUET CUFF) IMPLANT
DRAIN TLS ROUND 10FR (DRAIN) IMPLANT
DRAPE OEC MINIVIEW 54X84 (DRAPES) ×3 IMPLANT
DRAPE SURG 17X23 STRL (DRAPES) ×3 IMPLANT
DRSG ADAPTIC 3X8 NADH LF (GAUZE/BANDAGES/DRESSINGS) ×2 IMPLANT
GAUZE SPONGE 4X4 12PLY STRL LF (GAUZE/BANDAGES/DRESSINGS) ×2 IMPLANT
GAUZE XEROFORM 5X9 LF (GAUZE/BANDAGES/DRESSINGS) ×2 IMPLANT
GLOVE SS BIOGEL STRL SZ 8 (GLOVE) ×1 IMPLANT
GLOVE SUPERSENSE BIOGEL SZ 8 (GLOVE) ×2
GOWN STRL REUS W/ TWL LRG LVL3 (GOWN DISPOSABLE) ×1 IMPLANT
GOWN STRL REUS W/ TWL XL LVL3 (GOWN DISPOSABLE) ×1 IMPLANT
GOWN STRL REUS W/TWL LRG LVL3 (GOWN DISPOSABLE)
GOWN STRL REUS W/TWL XL LVL3 (GOWN DISPOSABLE) ×6
KIT BASIN OR (CUSTOM PROCEDURE TRAY) ×3 IMPLANT
KIT TURNOVER KIT B (KITS) ×3 IMPLANT
LOOP VESSEL MAXI BLUE (MISCELLANEOUS) IMPLANT
MANIFOLD NEPTUNE II (INSTRUMENTS) ×1 IMPLANT
NEEDLE 22X1 1/2 (OR ONLY) (NEEDLE) IMPLANT
NS IRRIG 1000ML POUR BTL (IV SOLUTION) ×3 IMPLANT
PACK ORTHO EXTREMITY (CUSTOM PROCEDURE TRAY) ×3 IMPLANT
PAD ARMBOARD 7.5X6 YLW CONV (MISCELLANEOUS) ×6 IMPLANT
PAD CAST 3X4 CTTN HI CHSV (CAST SUPPLIES) ×1 IMPLANT
PAD CAST 4YDX4 CTTN HI CHSV (CAST SUPPLIES) ×1 IMPLANT
PADDING CAST COTTON 3X4 STRL (CAST SUPPLIES) ×3
PADDING CAST COTTON 4X4 STRL (CAST SUPPLIES) ×3
PEG LOCKING SMOOTH 2.2X18 (Peg) ×6 IMPLANT
PEG LOCKING SMOOTH 2.2X20 (Screw) ×4 IMPLANT
PEG LOCKING SMOOTH 2.2X22 (Screw) ×4 IMPLANT
PILLOW ARM CARTER ADULT (MISCELLANEOUS) ×2 IMPLANT
PLATE NARROW DVR LEFT (Plate) ×2 IMPLANT
SCREW LOCK 12X2.7X 3 LD (Screw) IMPLANT
SCREW LOCK 14X2.7X 3 LD TPR (Screw) IMPLANT
SCREW LOCKING 2.7X12MM (Screw) ×6 IMPLANT
SCREW LOCKING 2.7X13MM (Screw) ×2 IMPLANT
SCREW LOCKING 2.7X14 (Screw) ×3 IMPLANT
SOL PREP POV-IOD 4OZ 10% (MISCELLANEOUS) ×4 IMPLANT
SPONGE LAP 4X18 RFD (DISPOSABLE) IMPLANT
SUT MNCRL AB 4-0 PS2 18 (SUTURE) ×1 IMPLANT
SUT PROLENE 3 0 PS 2 (SUTURE) IMPLANT
SUT PROLENE 4 0 PS 2 18 (SUTURE) ×4 IMPLANT
SUT VIC AB 3-0 FS2 27 (SUTURE) ×2 IMPLANT
SYR CONTROL 10ML LL (SYRINGE) IMPLANT
SYSTEM CHEST DRAIN TLS 7FR (DRAIN) IMPLANT
TOWEL GREEN STERILE (TOWEL DISPOSABLE) ×3 IMPLANT
TOWEL GREEN STERILE FF (TOWEL DISPOSABLE) ×3 IMPLANT
TUBE CONNECTING 12'X1/4 (SUCTIONS) ×1
TUBE CONNECTING 12X1/4 (SUCTIONS) ×2 IMPLANT
TUBE EVACUATION TLS (MISCELLANEOUS) ×1 IMPLANT
UNDERPAD 30X36 HEAVY ABSORB (UNDERPADS AND DIAPERS) ×5 IMPLANT
WATER STERILE IRR 1000ML POUR (IV SOLUTION) ×3 IMPLANT

## 2020-05-16 NOTE — Transfer of Care (Signed)
Immediate Anesthesia Transfer of Care Note  Patient: Pamela Black  Procedure(s) Performed: OPEN REDUCTION INTERNAL FIXATION (ORIF) Left distal radius fracture with repair as necessary (Left Wrist)  Patient Location: PACU  Anesthesia Type:MAC combined with regional for post-op pain  Level of Consciousness: awake, alert , oriented and patient cooperative  Airway & Oxygen Therapy: Patient Spontanous Breathing and Patient connected to nasal cannula oxygen  Post-op Assessment: Report given to RN and Post -op Vital signs reviewed and stable  Post vital signs: Reviewed and stable  Last Vitals:  Vitals Value Taken Time  BP 91/67 05/16/20 0930  Temp 36.2 C 05/16/20 0930  Pulse 77 05/16/20 0932  Resp 17 05/16/20 0932  SpO2 99 % 05/16/20 0932  Vitals shown include unvalidated device data.  Last Pain:  Vitals:   05/16/20 0930  TempSrc:   PainSc: 0-No pain      Patients Stated Pain Goal: 4 (51/02/58 5277)  Complications: No complications documented.

## 2020-05-16 NOTE — Anesthesia Procedure Notes (Signed)
Anesthesia Regional Block: Supraclavicular block   Pre-Anesthetic Checklist: ,, timeout performed, Correct Patient, Correct Site, Correct Laterality, Correct Procedure, Correct Position, site marked, Risks and benefits discussed,  Surgical consent,  Pre-op evaluation,  At surgeon's request and post-op pain management  Laterality: Left  Prep: chloraprep       Needles:  Injection technique: Single-shot  Needle Type: Echogenic Stimulator Needle     Needle Length: 9cm  Needle Gauge: 21     Additional Needles:   Procedures:,,,, ultrasound used (permanent image in chart),,,,  Narrative:  Start time: 05/16/2020 7:18 AM End time: 05/16/2020 7:25 AM Injection made incrementally with aspirations every 5 mL.  Performed by: Personally  Anesthesiologist: Shelton Silvas, MD  Additional Notes: Patient tolerated the procedure well. Local anesthetic introduced in an incremental fashion under minimal resistance after negative aspirations. No paresthesias were elicited. After completion of the procedure, no acute issues were identified and patient continued to be monitored by RN.

## 2020-05-16 NOTE — Op Note (Signed)
Operative note-May 16, 2020  Dominica Severin MD  Preoperative diagnosis: Left distal radius fracture comminuted complex greater than 3 part intra-articular  Postop diagnosis: Same  Procedure: #1 left wrist open reduction internal fixation comminuted complex distal radius fracture with DVR [employer] cross lock plate and screw construct.  This was a greater than 3 part intra-articular fracture.  #2 AP lateral and oblique x-rays performed examined and interpreted by myself  Naudia Crosley MD  Anesthesia: Block with IV sedation  Estimated blood loss minimal  Complications none immediate  Operative indications the patient presents for evaluation and surgical care.  Patient understands risk benefits and desires to proceed.  We have discussed with the patient all issues plans and concerns with this in mind we will proceed accordingly. We are planning surgery for your upper extremity. The risk and benefits of surgery to include risk of bleeding, infection, anesthesia,  damage to normal structures and failure of the surgery to accomplish its intended goals of relieving symptoms and restoring function have been discussed in detail. With this in mind we plan to proceed. I have specifically discussed with the patient the pre-and postoperative regime and the dos and don'ts and risk and benefits in great detail. Risk and benefits of surgery also include risk of dystrophy(CRPS), chronic nerve pain, failure of the healing process to go onto completion and other inherent risks of surgery The relavent the pathophysiology of the disease/injury process, as well as the alternatives for treatment and postoperative course of action has been discussed in great detail with the patient who desires to proceed.  We will do everything in our power to help you (the patient) restore function to the upper extremity. It is a pleasure to see this patient today.    Operative procedure: Patient was seen by myself and anesthesia.   Appropriate anesthesia was induced and following this the patient was prepped with a Hibiclens pre-scrub followed by 10-minute surgical Betadine scrub and paint.  Once this was completed the extremity was elevated and the tourniquet was insufflated to 250 mmHg.  Timeout was observed preoperative antibiotics were given and the patient then underwent a very careful and cautious approach to the extremity with volar radial incision under 250 mm tourniquet control.  FCR tendon sheath was identified and dissected.  There were no complicating features.  Once this was completed the carpal canal contents were retracted ulnarly and the FCR was retracted radially.  We took very meticulous care of the radial artery and the carpal canal contents during the approach.  The pronator was accessed incised and lifted off of the fracture.  The fracture was then reassembled with standard orthopedic equipment and a DVR plate and screw construct from [employer] was accomplished in terms of placement and fixation of the fracture.  Adequate radial height, volar tilt and radial inclination was restored.  The distal radial ulnar joint, radiocarpal and midcarpal joints all were  stable and satisfactory.  We irrigated copiously and closed the pronator with 3-0 Vicryl followed by closure of the skin edge with Prolene.  Once again, the distal radius underwent open reduction internal fixation without complications.  The distal radial ulnar joint was stable.  The patient had no complications.  All radiographic parameters look quite well following the fixation.  Standard dressing of Adaptic Xeroform 4 x 4's gauze web roll Kerlix and a volar splint were applied.  The patient understands instructions of elevate move massage fingers notify us any problems occur and follow-up care according to our standard protocol  for a DVR plate and screw construct.  He has been a pleasure participate in the patient's care and we look forward to spent in  the patient's recovery. This was a narrow DVR plate and screw construct to repair the comminuted complex greater than 3 part intra-articular fracture. Dominica Severin MD

## 2020-05-16 NOTE — H&P (Signed)
Pamela Black is an 54 y.o. female.   Chief Complaint--left comminuted complex wrist fracture HPI: Patient presents for surgical management left comminuted complex wrist fracture.  We are planning surgery for your upper extremity. The risk and benefits of surgery to include risk of bleeding, infection, anesthesia,  damage to normal structures and failure of the surgery to accomplish its intended goals of relieving symptoms and restoring function have been discussed in detail. With this in mind we plan to proceed. I have specifically discussed with the patient the pre-and postoperative regime and the dos and don'ts and risk and benefits in great detail. Risk and benefits of surgery also include risk of dystrophy(CRPS), chronic nerve pain, failure of the healing process to go onto completion and other inherent risks of surgery The relavent the pathophysiology of the disease/injury process, as well as the alternatives for treatment and postoperative course of action has been discussed in great detail with the patient who desires to proceed.  We will do everything in our power to help you (the patient) restore function to the upper extremity. It is a pleasure to see this patient today.   Past Medical History:  Diagnosis Date  . Anxiety   . Depression   . H/O seasonal allergies   . Neuropathy    tip of 5th finger left hand- injuried- 11/2019    Past Surgical History:  Procedure Laterality Date  . VESICOVAGINAL FISTULA CLOSURE    . WISDOM TOOTH EXTRACTION     was given gas and "woke up"    History reviewed. No pertinent family history. Social History:  reports that she has never smoked. She has never used smokeless tobacco. She reports current drug use. Drug: Marijuana. She reports that she does not drink alcohol.  Allergies:  Allergies  Allergen Reactions  . Codeine Itching  . Sulfa Antibiotics Nausea And Vomiting    Medications Prior to Admission  Medication Sig Dispense Refill  .  ALPRAZolam (XANAX) 1 MG tablet Take 0.5-1 mg by mouth See admin instructions. Take 0.5 mg in the morning and lunch and 1 mg at bedtime if needed    . HYDROcodone-acetaminophen (NORCO/VICODIN) 5-325 MG tablet Take 1 tablet by mouth every 4 (four) hours as needed. 12 tablet 0  . sertraline (ZOLOFT) 100 MG tablet Take 150 mg by mouth daily.    . methocarbamol (ROBAXIN) 500 MG tablet Take 500 mg by mouth daily as needed for muscle spasms.       Results for orders placed or performed during the hospital encounter of 05/15/20 (from the past 48 hour(s))  SARS CORONAVIRUS 2 (TAT 6-24 HRS) Nasopharyngeal Nasopharyngeal Swab     Status: None   Collection Time: 05/15/20 10:34 AM   Specimen: Nasopharyngeal Swab  Result Value Ref Range   SARS Coronavirus 2 NEGATIVE NEGATIVE    Comment: (NOTE) SARS-CoV-2 target nucleic acids are NOT DETECTED.  The SARS-CoV-2 RNA is generally detectable in upper and lower respiratory specimens during the acute phase of infection. Negative results do not preclude SARS-CoV-2 infection, do not rule out co-infections with other pathogens, and should not be used as the sole basis for treatment or other patient management decisions. Negative results must be combined with clinical observations, patient history, and epidemiological information. The expected result is Negative.  Fact Sheet for Patients: HairSlick.no  Fact Sheet for Healthcare Providers: quierodirigir.com  This test is not yet approved or cleared by the [place] FDA and  has been authorized for detection and/or diagnosis of SARS-CoV-2  by FDA under an Emergency Use Authorization (EUA). This EUA will remain  in effect (meaning this test can be used) for the duration of the COVID-19 declaration under Se ction 564(b)(1) of the Act, 21 U.S.C. section 360bbb-3(b)(1), unless the authorization is terminated or revoked sooner.  Performed at [facility], [address], [place], [place] [address]    DG MINI C-ARM IMAGE ONLY  Result Date: 05/16/2020 There is no interpretation for this exam.  This order is for images obtained during a surgical procedure.  Please See "Surgeries" Tab for more information regarding the procedure.    Review of Systems  Respiratory: Negative.   Cardiovascular: Negative.   Gastrointestinal: Negative.   Endocrine: Negative.   Genitourinary: Negative.     Blood pressure (!) 95/59, pulse 80, temperature 98.3 F (36.8 C), temperature source Oral, resp. rate 16, height 5' 5.25" (1.657 m), weight 49.9 kg, SpO2 94 %. Physical Exam  Left comminuted complex wrist fracture.  Patient I discussed all issues plans and concerns will proceed accordingly with operative management.  X-rays and findings have been reviewed.  She has no compartment syndrome.  Refill is excellent.  Right upper extremity is neurovascularly intact lower extremity examination is benign.  The patient is alert and oriented in no acute distress. The patient complains of pain in the affected upper extremity.  The patient is noted to have a normal HEENT exam. Lung fields show equal chest expansion and no shortness of breath. Abdomen exam is nontender without distention. Lower extremity examination does not show any fracture dislocation or blood clot symptoms. Pelvis is stable and the neck and back are stable and nontender. Assessment/Plan We will plan for open reduction internal fixation left comminuted complex greater than 5 part wrist fracture with repair reconstruction is necessary.  We are planning surgery for your upper extremity. The risk and benefits of surgery to include risk of bleeding, infection, anesthesia,  damage to normal structures and failure of the surgery to accomplish its intended goals of relieving symptoms and restoring function have been discussed in detail. With this in mind we plan to proceed. I have specifically  discussed with the patient the pre-and postoperative regime and the dos and don'ts and risk and benefits in great detail. Risk and benefits of surgery also include risk of dystrophy(CRPS), chronic nerve pain, failure of the healing process to go onto completion and other inherent risks of surgery The relavent the pathophysiology of the disease/injury process, as well as the alternatives for treatment and postoperative course of action has been discussed in great detail with the patient who desires to proceed.  We will do everything in our power to help you (the patient) restore function to the upper extremity. It is a pleasure to see this patient today.   Oletta Cohn III, MD 05/16/2020, 8:00 AM

## 2020-05-16 NOTE — Discharge Instructions (Signed)
Please make sure to elevate move and massage her fingers.  Please keep your hand elevated as this controls the swelling which causes pain after surgery.  Move massage and elevate your hand and wrist at all times.  Please use the foam pillow to help you with your elevation.  We have phoned in additional medicine to your pharmacy to include a nausea medicine and antibiotic.  Please take these as directed and as needed  We recommend that you to take vitamin C 1000 mg a day to promote healing. We also recommend that if you require  pain medicine that you take a stool softener to prevent constipation as most pain medicines will have constipation side effects. We recommend either Peri-Colace or Senokot and recommend that you also consider adding MiraLAX as well to prevent the constipation affects from pain medicine if you are required to use them. These medicines are over the counter and may be purchased at a local pharmacy. A cup of yogurt and a probiotic can also be helpful during the recovery process as the medicines can disrupt your intestinal environment. Keep bandage clean and dry.  Call for any problems.  No smoking.  Criteria for driving a car: you should be off your pain medicine for 7-8 hours, able to drive one handed(confident), thinking clearly and feeling able in your judgement to drive. Continue elevation as it will decrease swelling.  If instructed by MD move your fingers within the confines of the bandage/splint.  Use ice if instructed by your MD. Call immediately for any sudden loss of feeling in your hand/arm or change in functional abilities of the extremity.

## 2020-05-16 NOTE — Anesthesia Postprocedure Evaluation (Signed)
Anesthesia Post Note  Patient: Pamela Black  Procedure(s) Performed: OPEN REDUCTION INTERNAL FIXATION (ORIF) Left distal radius fracture with repair as necessary (Left Wrist)     Patient location during evaluation: PACU Anesthesia Type: Regional Level of consciousness: awake and alert Pain management: pain level controlled Vital Signs Assessment: post-procedure vital signs reviewed and stable Respiratory status: spontaneous breathing, nonlabored ventilation, respiratory function stable and patient connected to nasal cannula oxygen Cardiovascular status: stable and blood pressure returned to baseline Postop Assessment: no apparent nausea or vomiting Anesthetic complications: no   No complications documented.  Last Vitals:  Vitals:   05/16/20 1015 05/16/20 1030  BP: (!) 114/56 110/75  Pulse: 79 66  Resp: 20 16  Temp:    SpO2: 92% 94%    Last Pain:  Vitals:   05/16/20 1030  TempSrc:   PainSc: 0-No pain                 Shelton Silvas

## 2020-05-19 ENCOUNTER — Encounter (HOSPITAL_COMMUNITY): Payer: Self-pay | Admitting: Orthopedic Surgery

## 2021-09-26 IMAGING — DX DG FOREARM 2V*L*
2 series · 2 of 2 positions shown · non-contrast
Comparison: Current left wrist radiographs.

CLINICAL DATA: Fell last night.  Left wrist and forearm pain.

EXAM:
LEFT FOREARM - 2 VIEW

[forearm ap]
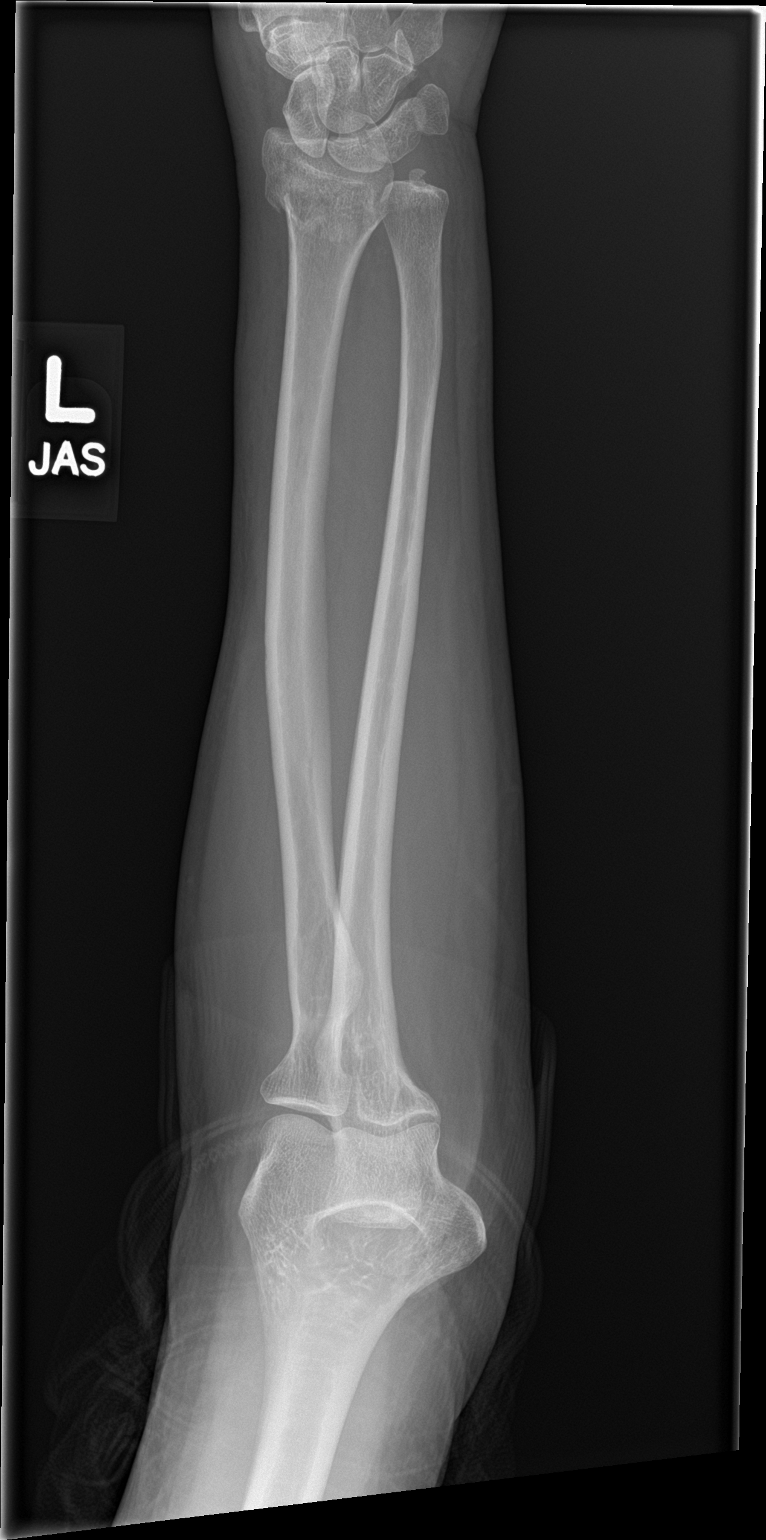

[forearm lat]
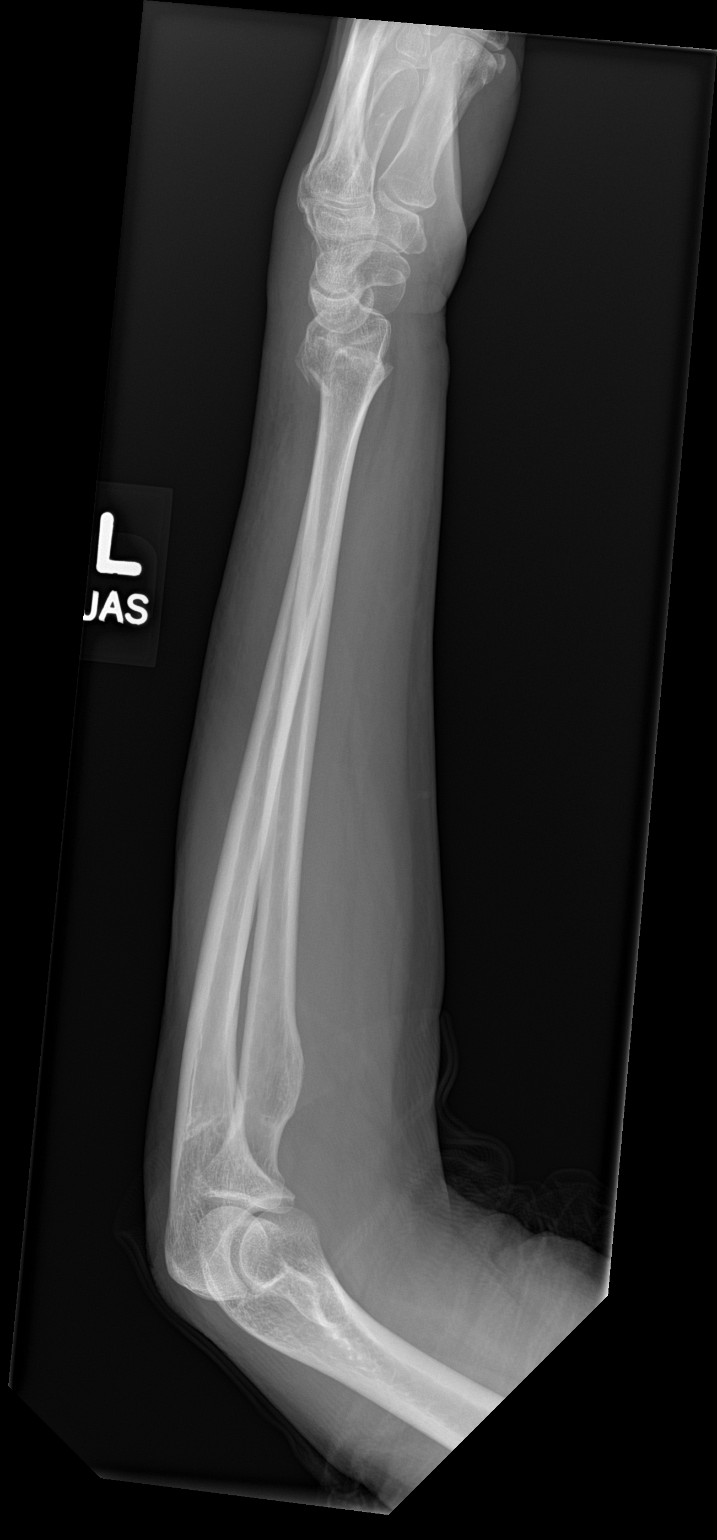

[2 of 2 positions shown; findings below may reference images not displayed]

FINDINGS: Fracture of the distal radius and ulnar styloid are again noted,
described under the left wrist radiographs.

No additional fractures. Wrist and elbow joints are normally spaced
and aligned.

There is distal forearm and wrist soft tissue swelling.
IMPRESSION: 1. Distal radius and ulnar styloid fractures as detailed under the
left wrist radiographs.
2. No other fractures.  No dislocation

## 2024-03-18 DIAGNOSIS — H25813 Combined forms of age-related cataract, bilateral: Secondary | ICD-10-CM | POA: Diagnosis not present

## 2024-03-18 DIAGNOSIS — H27112 Subluxation of lens, left eye: Secondary | ICD-10-CM | POA: Diagnosis not present

## 2024-03-18 DIAGNOSIS — H40013 Open angle with borderline findings, low risk, bilateral: Secondary | ICD-10-CM | POA: Diagnosis not present

## 2024-03-18 DIAGNOSIS — H524 Presbyopia: Secondary | ICD-10-CM | POA: Diagnosis not present

## 2024-03-18 DIAGNOSIS — S0512XS Contusion of eyeball and orbital tissues, left eye, sequela: Secondary | ICD-10-CM | POA: Diagnosis not present
